# Patient Record
Sex: Female | Born: 1970 | Race: White | Hispanic: No | Marital: Married | Smoking: Former smoker
Health system: Southern US, Community
[De-identification: ages and names within clinical notes are randomized; demographics above are authoritative.]

## PROBLEM LIST (undated history)

## (undated) DIAGNOSIS — F329 Major depressive disorder, single episode, unspecified: Secondary | ICD-10-CM

## (undated) DIAGNOSIS — M069 Rheumatoid arthritis, unspecified: Secondary | ICD-10-CM

## (undated) DIAGNOSIS — F32A Depression, unspecified: Secondary | ICD-10-CM

## (undated) DIAGNOSIS — E079 Disorder of thyroid, unspecified: Secondary | ICD-10-CM

## (undated) HISTORY — PX: MANDIBLE FRACTURE SURGERY: SHX706

## (undated) HISTORY — PX: TONSILLECTOMY: SUR1361

---

## 1999-10-23 ENCOUNTER — Other Ambulatory Visit: Admission: RE | Admit: 1999-10-23 | Discharge: 1999-10-23 | Payer: Self-pay | Admitting: Obstetrics and Gynecology

## 2001-03-17 ENCOUNTER — Other Ambulatory Visit: Admission: RE | Admit: 2001-03-17 | Discharge: 2001-03-17 | Payer: Self-pay | Admitting: *Deleted

## 2002-03-10 ENCOUNTER — Other Ambulatory Visit: Admission: RE | Admit: 2002-03-10 | Discharge: 2002-03-10 | Payer: Self-pay | Admitting: Obstetrics and Gynecology

## 2003-09-14 ENCOUNTER — Other Ambulatory Visit: Admission: RE | Admit: 2003-09-14 | Discharge: 2003-09-14 | Payer: Self-pay | Admitting: Obstetrics and Gynecology

## 2005-01-17 ENCOUNTER — Other Ambulatory Visit: Admission: RE | Admit: 2005-01-17 | Discharge: 2005-01-17 | Payer: Self-pay | Admitting: Obstetrics and Gynecology

## 2007-05-08 ENCOUNTER — Other Ambulatory Visit: Admission: RE | Admit: 2007-05-08 | Discharge: 2007-05-08 | Payer: Self-pay | Admitting: Obstetrics and Gynecology

## 2007-09-29 ENCOUNTER — Encounter: Admission: RE | Admit: 2007-09-29 | Discharge: 2007-09-29 | Payer: Self-pay

## 2015-02-05 ENCOUNTER — Encounter (HOSPITAL_COMMUNITY): Payer: Self-pay | Admitting: Emergency Medicine

## 2015-02-05 ENCOUNTER — Emergency Department (HOSPITAL_COMMUNITY)
Admission: EM | Admit: 2015-02-05 | Discharge: 2015-02-05 | Disposition: A | Discharge status: Home / Self Care | Payer: 59 | Attending: Emergency Medicine | Admitting: Emergency Medicine

## 2015-02-05 ENCOUNTER — Emergency Department (HOSPITAL_COMMUNITY): Payer: 59

## 2015-02-05 DIAGNOSIS — Z87891 Personal history of nicotine dependence: Secondary | ICD-10-CM | POA: Diagnosis not present

## 2015-02-05 DIAGNOSIS — Z79899 Other long term (current) drug therapy: Secondary | ICD-10-CM | POA: Diagnosis not present

## 2015-02-05 DIAGNOSIS — E079 Disorder of thyroid, unspecified: Secondary | ICD-10-CM | POA: Diagnosis not present

## 2015-02-05 DIAGNOSIS — M069 Rheumatoid arthritis, unspecified: Secondary | ICD-10-CM | POA: Diagnosis not present

## 2015-02-05 DIAGNOSIS — S52121A Displaced fracture of head of right radius, initial encounter for closed fracture: Secondary | ICD-10-CM

## 2015-02-05 DIAGNOSIS — F329 Major depressive disorder, single episode, unspecified: Secondary | ICD-10-CM | POA: Insufficient documentation

## 2015-02-05 DIAGNOSIS — S52611A Displaced fracture of right ulna styloid process, initial encounter for closed fracture: Secondary | ICD-10-CM | POA: Insufficient documentation

## 2015-02-05 DIAGNOSIS — Y92830 Public park as the place of occurrence of the external cause: Secondary | ICD-10-CM | POA: Insufficient documentation

## 2015-02-05 DIAGNOSIS — W1839XA Other fall on same level, initial encounter: Secondary | ICD-10-CM | POA: Insufficient documentation

## 2015-02-05 DIAGNOSIS — W19XXXA Unspecified fall, initial encounter: Secondary | ICD-10-CM

## 2015-02-05 DIAGNOSIS — Y9389 Activity, other specified: Secondary | ICD-10-CM | POA: Diagnosis not present

## 2015-02-05 DIAGNOSIS — Y998 Other external cause status: Secondary | ICD-10-CM | POA: Diagnosis not present

## 2015-02-05 DIAGNOSIS — S52201A Unspecified fracture of shaft of right ulna, initial encounter for closed fracture: Secondary | ICD-10-CM

## 2015-02-05 DIAGNOSIS — S6991XA Unspecified injury of right wrist, hand and finger(s), initial encounter: Secondary | ICD-10-CM | POA: Diagnosis present

## 2015-02-05 DIAGNOSIS — S52501A Unspecified fracture of the lower end of right radius, initial encounter for closed fracture: Secondary | ICD-10-CM | POA: Insufficient documentation

## 2015-02-05 HISTORY — DX: Disorder of thyroid, unspecified: E07.9

## 2015-02-05 HISTORY — DX: Major depressive disorder, single episode, unspecified: F32.9

## 2015-02-05 HISTORY — DX: Depression, unspecified: F32.A

## 2015-02-05 HISTORY — DX: Rheumatoid arthritis, unspecified: M06.9

## 2015-02-05 MED ORDER — HYDROMORPHONE HCL 1 MG/ML IJ SOLN
1.0000 mg | Freq: Once | INTRAMUSCULAR | Status: AC
  Administered 2015-02-05: 1 mg via INTRAMUSCULAR
  Filled 2015-02-05: qty 1

## 2015-02-05 MED ORDER — ONDANSETRON 4 MG PO TBDP
4.0000 mg | ORAL_TABLET | Freq: Once | ORAL | Status: AC
  Administered 2015-02-05: 4 mg via ORAL
  Filled 2015-02-05: qty 1

## 2015-02-05 MED ORDER — OXYCODONE-ACETAMINOPHEN 5-325 MG PO TABS: 1.0000 | ORAL_TABLET | Freq: Once | ORAL | Status: DC

## 2015-02-05 MED ORDER — MORPHINE SULFATE (PF) 4 MG/ML IV SOLN
4.0000 mg | Freq: Once | INTRAVENOUS | Status: AC
  Administered 2015-02-05: 4 mg via INTRAMUSCULAR
  Filled 2015-02-05: qty 1

## 2015-02-05 MED ORDER — OXYCODONE-ACETAMINOPHEN 5-325 MG PO TABS: 1.0000 | ORAL_TABLET | ORAL | Status: AC | PRN

## 2015-02-05 NOTE — ED Notes (Signed)
Pt reported rt wrist pain s/p to bracing self while falling.  Noted obvious deformity/swelling. (+)PMS, CRT brisk, LROM, no bruising noted.

## 2015-02-05 NOTE — ED Provider Notes (Signed)
Patient seen by Harolyn Rutherford, PA-C  No results found for this or any previous visit. Dg Forearm Right  02/05/2015  CLINICAL DATA:  Status post fall on outstretched hand while jumping on a trampoline today. Initial encounter. EXAM: RIGHT FOREARM - 2 VIEW COMPARISON:  Plain films of the right wrist this same day. FINDINGS: Distal radius and ulnar fractures are identified as seen on the comparison plain films. No other acute bony or joint abnormality is seen. IMPRESSION: Acute distal right radius and ulnar fractures. No other abnormality. Electronically Signed   By: Drusilla Kanner M.D.   On: 02/05/2015 16:58   Dg Wrist Complete Right  02/05/2015  CLINICAL DATA:  Fall on outstretched hand today. Right wrist pain and deformity EXAM: RIGHT WRIST - COMPLETE 3+ VIEW COMPARISON:  None. FINDINGS: There is a transverse fracture across the distal radial metaphysis. Fracture is displaced in a radial direction by 1 cm. Fracture is also mildly impacted with dorsal angulation of the articular surface. Dorsal angulation measures approximately 34 degrees. There is a small comminuted fracture component that projects over the dorsal margin of the distal radius on the lateral view. There is also an associated ulnar styloid fracture which is displaced in a radial direction, also by approximately 1 cm. The carpus has displaced with the distal radial fracture component. There is no dislocation. No other fractures. There is diffuse surrounding soft tissue swelling. IMPRESSION: Displaced, angulated fracture of the distal radial metaphysis as described with an associated displaced ulnar styloid fracture. Electronically Signed   By: Amie Portland M.D.   On: 02/05/2015 16:58     Teresa Singh  has sustained a significant CLOSED radial ulnar fracture which is surgical. Shawn, PA-C spoke with Dr. Mina Marble, he reviewed the xrays and he requests sugartong splint and she will have surgery in the AM tomorrow.  She has been reminded not  to eat anything after midnight.  RICE and elevate as much as possible. Ring on injured hand removed while in the ED.  Patient has strong radial pulses, intact sensations to all 5 fingers and  Less than 2 second CR in all five digits.    Marlon Pel, PA-C 02/05/15 1751  Marlon Pel, PA-C 02/05/15 1816  Lorre Nick, MD 02/05/15 [phone]

## 2015-02-05 NOTE — ED Provider Notes (Signed)
CSN: [id number]     Arrival date & time 02/05/15  1601 History   First MD Initiated Contact with Patient 02/05/15 1616     Chief Complaint  Patient presents with  . Wrist Injury     (Consider location/radiation/quality/duration/timing/severity/associated sxs/prior Treatment) HPI  Teresa Singh is a 44 y.o. female, pt with no significant previous history, presents with an isolated right wrist injury with obvious deformity after falling on an outstretched arm while playing with her child at a "trampoline park."  Pt denies hitting her head, LOC, neck/back pain, N/V, dizziness, or any other pain or injuries. Pt rates pain 10/10, sharp/throbbing, with radiation up the forearm.   Past Medical History  Diagnosis Date  . Rheumatoid arthritis (HCC)   . Thyroid disease   . Depression    Past Surgical History  Procedure Laterality Date  . Mandible fracture surgery    . Tonsillectomy     History reviewed. No pertinent family history. Social History  Substance Use Topics  . Smoking status: Former Smoker    Types: Cigarettes  . Smokeless tobacco: None  . Alcohol Use: Yes     Comment: occ   OB History    No data available     Review of Systems  Constitutional: Negative for diaphoresis.  Respiratory: Negative for shortness of breath.   Cardiovascular: Negative for chest pain.  Gastrointestinal: Negative for abdominal pain.  Musculoskeletal: Positive for arthralgias. Negative for back pain and neck pain.       Wrist pain  Neurological: Negative for dizziness, syncope, weakness, light-headedness, numbness and headaches.      Allergies  Review of patient's allergies indicates not on file.  Home Medications   Prior to Admission medications   Medication Sig Start Date End Date Taking? Authorizing Provider  ENBREL SURECLICK 50 MG/ML injection Inject 50 mg into the skin once a week. Thursday 11/03/14  Yes Historical Provider, MD  FIBER SELECT GUMMIES PO Take 2 tablets by mouth daily.    Yes Historical Provider, MD  hydroxychloroquine (PLAQUENIL) 200 MG tablet Take 400 mg by mouth daily. 01/25/15  Yes Historical Provider, MD  levothyroxine (SYNTHROID, LEVOTHROID) 100 MCG tablet Take 100 mcg by mouth daily. 02/03/15  Yes Historical Provider, MD  predniSONE (DELTASONE) 1 MG tablet For RA flare-up 01/25/15  Yes Historical Provider, MD  sertraline (ZOLOFT) 50 MG tablet Take 50 mg by mouth daily. 01/07/15  Yes Historical Provider, MD  triamcinolone (KENALOG) 0.025 % cream Apply 1 application topically 3 (three) times daily as needed (break outs).  01/09/15  Yes Historical Provider, MD  oxyCODONE-acetaminophen (PERCOCET/ROXICET) 5-325 MG tablet Take 1-2 tablets by mouth every 4 (four) hours as needed for severe pain. 02/05/15   Shawn C Joy, PA-C   BP 108/63 mmHg  Pulse 59  Temp(Src) 98.2 F (36.8 C) (Oral)  Resp 16  SpO2 98% Physical Exam  Constitutional: She appears well-developed and well-nourished. No distress.  HENT:  Head: Normocephalic and atraumatic.  Eyes: Conjunctivae are normal. Pupils are equal, round, and reactive to light.  Neck: Normal range of motion.  Cardiovascular: Normal rate and regular rhythm.   Capillary refill <2 sec distal to injury.  Pulmonary/Chest: Effort normal. No respiratory distress.  Abdominal: Soft. Bowel sounds are normal.  Musculoskeletal: She exhibits tenderness.  Full ROM in fingers distal to injury and right elbow. Full ROM of all other extremities. No head, c-spine, t-spine, or l-spine pain or tenderness. Motor intact.  Neurological: She is alert.  No numbness, tingling, weakness, or  other neurologic deficits.  Skin: Skin is warm and dry. She is not diaphoretic.  Nursing note and vitals reviewed.   ED Course  Procedures (including critical care time) Labs Review Labs Reviewed - No data to display  Imaging Review Dg Forearm Right  02/05/2015  CLINICAL DATA:  Status post fall on outstretched hand while jumping on a trampoline today.  Initial encounter. EXAM: RIGHT FOREARM - 2 VIEW COMPARISON:  Plain films of the right wrist this same day. FINDINGS: Distal radius and ulnar fractures are identified as seen on the comparison plain films. No other acute bony or joint abnormality is seen. IMPRESSION: Acute distal right radius and ulnar fractures. No other abnormality. Electronically Signed   By: Drusilla Kanner M.D.   On: 02/05/2015 16:58   Dg Wrist Complete Right  02/05/2015  CLINICAL DATA:  Fall on outstretched hand today. Right wrist pain and deformity EXAM: RIGHT WRIST - COMPLETE 3+ VIEW COMPARISON:  None. FINDINGS: There is a transverse fracture across the distal radial metaphysis. Fracture is displaced in a radial direction by 1 cm. Fracture is also mildly impacted with dorsal angulation of the articular surface. Dorsal angulation measures approximately 34 degrees. There is a small comminuted fracture component that projects over the dorsal margin of the distal radius on the lateral view. There is also an associated ulnar styloid fracture which is displaced in a radial direction, also by approximately 1 cm. The carpus has displaced with the distal radial fracture component. There is no dislocation. No other fractures. There is diffuse surrounding soft tissue swelling. IMPRESSION: Displaced, angulated fracture of the distal radial metaphysis as described with an associated displaced ulnar styloid fracture. Electronically Signed   By: Amie Portland M.D.   On: 02/05/2015 16:58   I have personally reviewed and evaluated these images and lab results as part of my medical decision-making.   EKG Interpretation None      MDM   Final diagnoses:  Radial head fracture, closed, right, initial encounter  Ulnar fracture, right, closed, initial encounter  Fall, initial encounter    Teresa Singh presents with obvious deformity to right wrist following a fall onto outstretched arm.   Injury suspicious for wrist fracture. Wrist and forearm  xrays and pain management ordered.  Xray shows displaced radial and ulnar fractures. Consult to hand surgery placed.  Pt updated with xray findings and possible avenues of care. Pt is open to anything that needs to be done. Spoke with Hydrographic surveyor, Dr. Mina Marble for consult. Dr. Mina Marble has xrays in front of him x2. Advised that this case is surgical and he can do it tomorrow or Wednesday, late morning.  Advised to place pt in a sugar tong splint, as is with no manipulation. Asked to pass the following instructions on to patient: call the office tomorrow at 8:30 to receive further instructions. NPO after midnight. This will be an outpatient jprocedure to go home the same day. Requests call back to confirm pt will be there tomorrow. Pt updated with details and proposed surgery date and plan of care. Pt and pt husband agree to plan of care and confirm the above instructions. Populated inbox message sent to Dr. Mina Marble, as requested with pt record attached.   Anselm Pancoast, PA-C 02/05/15 1821  Lorre Nick, MD 02/05/15 [phone]

## 2015-02-05 NOTE — Discharge Instructions (Signed)
You have been seen in the ED this evening for a wrist fracture. Dr. Mina Marble, a hand surgeon, was consulted on this injury and determined it is surgical. You are to call Dr. Ronie Spies office tomorrow morning at 8:30 to receive further instructions for your surgery. Your surgery will be an outpatient procedure, which means you will go home the same day. Nothing by mouth after midnight tonight. Your wrist will be splinted and you will be sent home with pain medications, but be sure to take nothing after midnight, unless the surgery office specifically advises you otherwise.    Cast or Splint Care Casts and splints support injured limbs and keep bones from moving while they heal. It is important to care for your cast or splint at home.  HOME CARE INSTRUCTIONS  Keep the cast or splint uncovered during the drying period. It can take 24 to 48 hours to dry if it is made of plaster. A fiberglass cast will dry in less than 1 hour.  Do not rest the cast on anything harder than a pillow for the first 24 hours.  Do not put weight on your injured limb or apply pressure to the cast until your health care provider gives you permission.  Keep the cast or splint dry. Wet casts or splints can lose their shape and may not support the limb as well. A wet cast that has lost its shape can also create harmful pressure on your skin when it dries. Also, wet skin can become infected.  Cover the cast or splint with a plastic bag when bathing or when out in the rain or snow. If the cast is on the trunk of the body, take sponge baths until the cast is removed.  If your cast does become wet, dry it with a towel or a blow dryer on the cool setting only.  Keep your cast or splint clean. Soiled casts may be wiped with a moistened cloth.  Do not place any hard or soft foreign objects under your cast or splint, such as cotton, toilet paper, lotion, or powder.  Do not try to scratch the skin under the cast with any object. The  object could get stuck inside the cast. Also, scratching could lead to an infection. If itching is a problem, use a blow dryer on a cool setting to relieve discomfort.  Do not trim or cut your cast or remove padding from inside of it.  Exercise all joints next to the injury that are not immobilized by the cast or splint. For example, if you have a long leg cast, exercise the hip joint and toes. If you have an arm cast or splint, exercise the shoulder, elbow, thumb, and fingers.  Elevate your injured arm or leg on 1 or 2 pillows for the first 1 to 3 days to decrease swelling and pain.It is best if you can comfortably elevate your cast so it is higher than your heart. SEEK MEDICAL CARE IF:   Your cast or splint cracks.  Your cast or splint is too tight or too loose.  You have unbearable itching inside the cast.  Your cast becomes wet or develops a soft spot or area.  You have a bad smell coming from inside your cast.  You get an object stuck under your cast.  Your skin around the cast becomes red or raw.  You have new pain or worsening pain after the cast has been applied. SEEK IMMEDIATE MEDICAL CARE IF:   You have  fluid leaking through the cast.  You are unable to move your fingers or toes.  You have discolored (blue or white), cool, painful, or very swollen fingers or toes beyond the cast.  You have tingling or numbness around the injured area.  You have severe pain or pressure under the cast.  You have any difficulty with your breathing or have shortness of breath.  You have chest pain.   This information is not intended to replace advice given to you by your health care provider. Make sure you discuss any questions you have with your health care provider.   Document Released: 04/05/2000 Document Revised: 01/27/2013 Document Reviewed: 10/15/2012 Elsevier Interactive Patient Education [employer]

## 2016-07-12 DIAGNOSIS — R5381 Other malaise: Secondary | ICD-10-CM | POA: Diagnosis not present

## 2016-07-12 DIAGNOSIS — R197 Diarrhea, unspecified: Secondary | ICD-10-CM | POA: Diagnosis not present

## 2016-07-18 DIAGNOSIS — R197 Diarrhea, unspecified: Secondary | ICD-10-CM | POA: Diagnosis not present

## 2016-07-18 DIAGNOSIS — R5381 Other malaise: Secondary | ICD-10-CM | POA: Diagnosis not present

## 2016-07-26 DIAGNOSIS — M0579 Rheumatoid arthritis with rheumatoid factor of multiple sites without organ or systems involvement: Secondary | ICD-10-CM | POA: Diagnosis not present

## 2016-07-26 DIAGNOSIS — G4700 Insomnia, unspecified: Secondary | ICD-10-CM | POA: Diagnosis not present

## 2016-07-26 DIAGNOSIS — Z79899 Other long term (current) drug therapy: Secondary | ICD-10-CM | POA: Diagnosis not present

## 2016-10-21 DIAGNOSIS — E039 Hypothyroidism, unspecified: Secondary | ICD-10-CM | POA: Diagnosis not present

## 2016-10-25 DIAGNOSIS — G4700 Insomnia, unspecified: Secondary | ICD-10-CM | POA: Diagnosis not present

## 2017-01-28 DIAGNOSIS — E785 Hyperlipidemia, unspecified: Secondary | ICD-10-CM | POA: Diagnosis not present

## 2017-01-28 DIAGNOSIS — E039 Hypothyroidism, unspecified: Secondary | ICD-10-CM | POA: Diagnosis not present

## 2017-02-25 DIAGNOSIS — Z23 Encounter for immunization: Secondary | ICD-10-CM | POA: Diagnosis not present

## 2017-02-25 DIAGNOSIS — Z79899 Other long term (current) drug therapy: Secondary | ICD-10-CM | POA: Diagnosis not present

## 2017-02-25 DIAGNOSIS — M0579 Rheumatoid arthritis with rheumatoid factor of multiple sites without organ or systems involvement: Secondary | ICD-10-CM | POA: Diagnosis not present

## 2017-04-28 DIAGNOSIS — E785 Hyperlipidemia, unspecified: Secondary | ICD-10-CM | POA: Diagnosis not present

## 2017-04-28 DIAGNOSIS — E039 Hypothyroidism, unspecified: Secondary | ICD-10-CM | POA: Diagnosis not present

## 2017-04-28 DIAGNOSIS — Z79899 Other long term (current) drug therapy: Secondary | ICD-10-CM | POA: Diagnosis not present

## 2017-05-01 DIAGNOSIS — M25562 Pain in left knee: Secondary | ICD-10-CM | POA: Diagnosis not present

## 2017-05-01 DIAGNOSIS — E039 Hypothyroidism, unspecified: Secondary | ICD-10-CM | POA: Diagnosis not present

## 2017-05-01 DIAGNOSIS — E785 Hyperlipidemia, unspecified: Secondary | ICD-10-CM | POA: Diagnosis not present

## 2017-07-13 DIAGNOSIS — M791 Myalgia, unspecified site: Secondary | ICD-10-CM | POA: Diagnosis not present

## 2017-07-30 DIAGNOSIS — G4700 Insomnia, unspecified: Secondary | ICD-10-CM | POA: Diagnosis not present

## 2017-08-29 DIAGNOSIS — Z79899 Other long term (current) drug therapy: Secondary | ICD-10-CM | POA: Diagnosis not present

## 2017-08-29 DIAGNOSIS — M0579 Rheumatoid arthritis with rheumatoid factor of multiple sites without organ or systems involvement: Secondary | ICD-10-CM | POA: Diagnosis not present

## 2017-10-04 DIAGNOSIS — M25531 Pain in right wrist: Secondary | ICD-10-CM | POA: Diagnosis not present

## 2017-10-07 DIAGNOSIS — M25531 Pain in right wrist: Secondary | ICD-10-CM | POA: Diagnosis not present

## 2017-10-27 DIAGNOSIS — E039 Hypothyroidism, unspecified: Secondary | ICD-10-CM | POA: Diagnosis not present

## 2017-10-27 DIAGNOSIS — E785 Hyperlipidemia, unspecified: Secondary | ICD-10-CM | POA: Diagnosis not present

## 2017-10-29 DIAGNOSIS — G4700 Insomnia, unspecified: Secondary | ICD-10-CM | POA: Diagnosis not present

## 2017-10-29 DIAGNOSIS — R635 Abnormal weight gain: Secondary | ICD-10-CM | POA: Diagnosis not present

## 2017-10-29 DIAGNOSIS — Z79899 Other long term (current) drug therapy: Secondary | ICD-10-CM | POA: Diagnosis not present

## 2017-10-29 DIAGNOSIS — Z6830 Body mass index (BMI) 30.0-30.9, adult: Secondary | ICD-10-CM | POA: Diagnosis not present

## 2017-10-29 DIAGNOSIS — M0579 Rheumatoid arthritis with rheumatoid factor of multiple sites without organ or systems involvement: Secondary | ICD-10-CM | POA: Diagnosis not present

## 2018-01-16 DIAGNOSIS — H6691 Otitis media, unspecified, right ear: Secondary | ICD-10-CM | POA: Diagnosis not present

## 2018-01-16 DIAGNOSIS — Z6830 Body mass index (BMI) 30.0-30.9, adult: Secondary | ICD-10-CM | POA: Diagnosis not present

## 2018-01-29 DIAGNOSIS — Z6830 Body mass index (BMI) 30.0-30.9, adult: Secondary | ICD-10-CM | POA: Diagnosis not present

## 2018-01-29 DIAGNOSIS — G4700 Insomnia, unspecified: Secondary | ICD-10-CM | POA: Diagnosis not present

## 2018-01-29 DIAGNOSIS — R635 Abnormal weight gain: Secondary | ICD-10-CM | POA: Diagnosis not present

## 2018-03-06 DIAGNOSIS — M25531 Pain in right wrist: Secondary | ICD-10-CM | POA: Diagnosis not present

## 2018-03-06 DIAGNOSIS — Z79899 Other long term (current) drug therapy: Secondary | ICD-10-CM | POA: Diagnosis not present

## 2018-03-06 DIAGNOSIS — M0579 Rheumatoid arthritis with rheumatoid factor of multiple sites without organ or systems involvement: Secondary | ICD-10-CM | POA: Diagnosis not present

## 2018-03-06 DIAGNOSIS — Z23 Encounter for immunization: Secondary | ICD-10-CM | POA: Diagnosis not present

## 2019-08-24 ENCOUNTER — Other Ambulatory Visit: Payer: Self-pay

## 2019-08-24 ENCOUNTER — Emergency Department (HOSPITAL_BASED_OUTPATIENT_CLINIC_OR_DEPARTMENT_OTHER): Payer: 59

## 2019-08-24 ENCOUNTER — Encounter (HOSPITAL_BASED_OUTPATIENT_CLINIC_OR_DEPARTMENT_OTHER): Payer: Self-pay | Admitting: Emergency Medicine

## 2019-08-24 ENCOUNTER — Emergency Department (HOSPITAL_BASED_OUTPATIENT_CLINIC_OR_DEPARTMENT_OTHER)
Admission: EM | Admit: 2019-08-24 | Discharge: 2019-08-24 | Disposition: A | Discharge status: Home / Self Care | Payer: 59 | Attending: Emergency Medicine | Admitting: Emergency Medicine

## 2019-08-24 DIAGNOSIS — Z87891 Personal history of nicotine dependence: Secondary | ICD-10-CM | POA: Insufficient documentation

## 2019-08-24 DIAGNOSIS — Z91040 Latex allergy status: Secondary | ICD-10-CM | POA: Diagnosis not present

## 2019-08-24 DIAGNOSIS — L03213 Periorbital cellulitis: Secondary | ICD-10-CM | POA: Diagnosis not present

## 2019-08-24 DIAGNOSIS — Z79899 Other long term (current) drug therapy: Secondary | ICD-10-CM | POA: Insufficient documentation

## 2019-08-24 DIAGNOSIS — H02843 Edema of right eye, unspecified eyelid: Secondary | ICD-10-CM | POA: Diagnosis present

## 2019-08-24 LAB — CBC WITH DIFFERENTIAL/PLATELET
Abs Immature Granulocytes: 0.01 10*3/uL (ref 0.00–0.07)
Basophils Absolute: 0 10*3/uL (ref 0.0–0.1)
Basophils Relative: 0 %
Eosinophils Absolute: 0 10*3/uL (ref 0.0–0.5)
Eosinophils Relative: 1 %
HCT: 35.2 % — ABNORMAL LOW (ref 36.0–46.0)
Hemoglobin: 11.9 g/dL — ABNORMAL LOW (ref 12.0–15.0)
Immature Granulocytes: 0 %
Lymphocytes Relative: 22 %
Lymphs Abs: 1 10*3/uL (ref 0.7–4.0)
MCH: 30.9 pg (ref 26.0–34.0)
MCHC: 33.8 g/dL (ref 30.0–36.0)
MCV: 91.4 fL (ref 80.0–100.0)
Monocytes Absolute: 0.3 10*3/uL (ref 0.1–1.0)
Monocytes Relative: 7 %
Neutro Abs: 3.3 10*3/uL (ref 1.7–7.7)
Neutrophils Relative %: 70 %
Platelets: 251 10*3/uL (ref 150–400)
RBC: 3.85 MIL/uL — ABNORMAL LOW (ref 3.87–5.11)
RDW: 12.2 % (ref 11.5–15.5)
WBC: 4.6 10*3/uL (ref 4.0–10.5)
nRBC: 0 % (ref 0.0–0.2)

## 2019-08-24 LAB — BASIC METABOLIC PANEL
Anion gap: 8 (ref 5–15)
BUN: 17 mg/dL (ref 6–20)
CO2: 26 mmol/L (ref 22–32)
Calcium: 8.3 mg/dL — ABNORMAL LOW (ref 8.9–10.3)
Chloride: 104 mmol/L (ref 98–111)
Creatinine, Ser: 0.81 mg/dL (ref 0.44–1.00)
GFR calc Af Amer: >60 mL/min (ref >60)
GFR calc non Af Amer: >60 mL/min (ref >60)
Glucose, Bld: 93 mg/dL (ref 70–99)
Potassium: 3.8 mmol/L (ref 3.5–5.1)
Sodium: 138 mmol/L (ref 135–145)

## 2019-08-24 LAB — URINALYSIS, ROUTINE W REFLEX MICROSCOPIC
Bilirubin Urine: NEGATIVE
Glucose, UA: NEGATIVE mg/dL
Hgb urine dipstick: NEGATIVE
Ketones, ur: NEGATIVE mg/dL
Leukocytes,Ua: NEGATIVE
Nitrite: NEGATIVE
Protein, ur: NEGATIVE mg/dL
Specific Gravity, Urine: <1.005 — ABNORMAL LOW (ref 1.005–1.030)
pH: 6 (ref 5.0–8.0)

## 2019-08-24 LAB — PREGNANCY, URINE: Preg Test, Ur: NEGATIVE

## 2019-08-24 MED ORDER — AMOXICILLIN-POT CLAVULANATE 875-125 MG PO TABS: 1.0000 | ORAL_TABLET | Freq: Two times a day (BID) | ORAL | 0 refills | Status: AC

## 2019-08-24 MED ORDER — IOHEXOL 300 MG/ML  SOLN
100.0000 mL | Freq: Once | INTRAMUSCULAR | Status: AC | PRN
  Administered 2019-08-24: 07:00:00 100 mL via INTRAVENOUS

## 2019-08-24 MED FILL — AMOX-CLAV 875-125 MG TABLET: 875-125 | 7 days supply | Qty: 14 | Fill #0

## 2019-08-24 NOTE — ED Provider Notes (Signed)
[facility] EMERGENCY DEPARTMENT Provider Note   CSN: [id number] Arrival date & time: 08/24/19  0631     History Chief Complaint  Patient presents with  . Facial Swelling    Teresa Singh is a 49 y.o. female.  She has a history of rheumatoid arthritis.  She is complaining of swelling around her right eye that started yesterday morning.  She received her second Covid vaccine after that and then went to the dentist and found out that she had a possible tooth infection also right upper.  The dentist put her on penicillin.  She woke up this morning and her eye was even more swollen.  She says it is itchy.  There is no real eye pain and no difficulty with vision or moving her eye.  She does not recall any new contact to the skin but also has a new patch of rash on her left neck.  She said she was in the yard doing yard work this weekend.  No fevers chills nausea vomiting trouble swallowing or breathing.  The history is provided by the patient.  Eye Problem Location:  Right eye Quality: itching. Severity:  Moderate Onset quality:  Gradual Duration:  2 days Timing:  Constant Progression:  Worsening Chronicity:  New Relieved by:  Nothing Worsened by:  Nothing Ineffective treatments: cool compress. Associated symptoms: itching and swelling   Associated symptoms: no blurred vision, no crusting, no discharge, no double vision, no headaches, no nausea, no photophobia, no redness, no scotomas and no vomiting        Past Medical History:  Diagnosis Date  . Depression   . Rheumatoid arthritis (HCC)   . Thyroid disease     There are no problems to display for this patient.   Past Surgical History:  Procedure Laterality Date  . MANDIBLE FRACTURE SURGERY    . TONSILLECTOMY       OB History   No obstetric history on file.     History reviewed. No pertinent family history.  Social History   Tobacco Use  . Smoking status: Former Smoker    Types: Cigarettes  .  Smokeless tobacco: Never Used  Substance Use Topics  . Alcohol use: Yes    Comment: occ  . Drug use: No    Home Medications Prior to Admission medications   Medication Sig Start Date End Date Taking? Authorizing Provider  penicillin v potassium (VEETID) 500 MG tablet Take 500 mg by mouth 4 (four) times daily.   Yes [provider]  ENBREL SURECLICK 50 MG/ML injection Inject 50 mg into the skin once a week. Thursday 11/03/14   [provider]  FIBER SELECT GUMMIES PO Take 2 tablets by mouth daily.    [provider]  hydroxychloroquine (PLAQUENIL) 200 MG tablet Take 400 mg by mouth daily. 01/25/15   [provider]  levothyroxine (SYNTHROID, LEVOTHROID) 100 MCG tablet Take 100 mcg by mouth daily. 02/03/15   [provider]  oxyCODONE-acetaminophen (PERCOCET/ROXICET) 5-325 MG tablet Take 1-2 tablets by mouth every 4 (four) hours as needed for severe pain. 02/05/15   Joy, Shawn C, PA-C  predniSONE (DELTASONE) 1 MG tablet For RA flare-up 01/25/15   [provider]  sertraline (ZOLOFT) 50 MG tablet Take 50 mg by mouth daily. 01/07/15   [provider]  triamcinolone (KENALOG) 0.025 % cream Apply 1 application topically 3 (three) times daily as needed (break outs).  01/09/15   [provider]    Allergies  Latex and Sulfamethoxazole  Review of Systems   Review of Systems  Constitutional: Negative for fever.  HENT: Positive for facial swelling. Negative for sore throat.   Eyes: Positive for itching. Negative for blurred vision, double vision, photophobia, discharge, redness and visual disturbance.  Respiratory: Negative for shortness of breath.   Cardiovascular: Negative for chest pain.  Gastrointestinal: Negative for abdominal pain, nausea and vomiting.  Genitourinary: Negative for dysuria.  Musculoskeletal: Negative for neck pain.  Skin: Positive for rash (2x2cm area sl red raised patch on neck).  Neurological:  Negative for headaches.    Physical Exam Updated Vital Signs BP 125/70 (BP Location: Right Arm)   Pulse 62   Temp 98.2 F (36.8 C) (Oral)   Resp 18   Ht 5\' 11"  (1.803 m)   Wt 104.3 kg   SpO2 97%   BMI 32.08 kg/m   Physical Exam Vitals and nursing note reviewed.  Constitutional:      General: She is not in acute distress.    Appearance: She is well-developed.  HENT:     Head: Normocephalic and atraumatic.  Eyes:     Conjunctiva/sclera: Conjunctivae normal.     Comments: She has some puffiness to the lid of her right eye upper and lower.  Eye itself clear anterior chamber pupil equal round reactive extraocular movements intact.  No findings on left eye.  Neck:     Comments: She is approximately 2 cm x 2 cm linear patch left lower neck raised slightly reddened without any weeping Cardiovascular:     Rate and Rhythm: Normal rate and regular rhythm.     Heart sounds: No murmur.  Pulmonary:     Effort: Pulmonary effort is normal. No respiratory distress.     Breath sounds: Normal breath sounds.  Abdominal:     Palpations: Abdomen is soft.     Tenderness: There is no abdominal tenderness.  Musculoskeletal:        General: No deformity or signs of injury.     Cervical back: Neck supple.  Skin:    General: Skin is warm and dry.     Capillary Refill: Capillary refill takes less than 2 seconds.  Neurological:     General: No focal deficit present.     Mental Status: She is alert.     ED Results / Procedures / Treatments   Labs (all labs ordered are listed, but only abnormal results are displayed) Labs Reviewed  BASIC METABOLIC PANEL - Abnormal; Notable for the following components:      Result Value   Calcium 8.3 (*)    All other components within normal limits  CBC WITH DIFFERENTIAL/PLATELET - Abnormal; Notable for the following components:   RBC 3.85 (*)    Hemoglobin 11.9 (*)    HCT 35.2 (*)    All other components within normal limits  URINALYSIS, ROUTINE W  REFLEX MICROSCOPIC - Abnormal; Notable for the following components:   Specific Gravity, Urine <1.005 (*)    All other components within normal limits  PREGNANCY, URINE    EKG None  Radiology CT Orbits W Contrast  Result Date: 08/24/2019 CLINICAL DATA:  Mass, lump or swelling, max face. Additional history provided: Patient presents with complaints of right eye swelling onset yesterday, worse this morning, received second COVID vaccine yesterday, went to dentist and was treated with penicillin for tooth infection. EXAM: CT ORBITS WITH CONTRAST TECHNIQUE: Multidetector CT images was performed according to the standard protocol following intravenous contrast administration. CONTRAST:  125mL OMNIPAQUE IOHEXOL 300 MG/ML  SOLN COMPARISON:  No pertinent prior studies available for comparison. FINDINGS: Orbits: There is right periorbital stranding and skin thickening consistent with periorbital cellulitis. No evidence of postseptal extension of infection at this time. No formed abscess is demonstrated. The globes are normal in size and contour. The extraocular muscles are symmetric and unremarkable. Visualized sinuses: Small mucous retention cysts within an anterior left ethmoid air cell and within the inferior maxillary sinuses bilaterally. Soft tissues: Right periorbital stranding and skin thickening suggestive periorbital cellulitis. Otherwise unremarkable. Limited intracranial: No abnormality identified. IMPRESSION: Right periorbital soft tissue stranding and skin thickening consistent with periorbital cellulitis. No evidence of postseptal extension of infection at this time. No formed abscess is demonstrated. Small mucous retention cysts within an anterior left ethmoid air cell and within the bilateral maxillary sinuses. The paranasal sinuses are otherwise well aerated. Electronically Signed   By: Kellie Simmering DO   On: 08/24/2019 07:51    Procedures Procedures (including critical care  time)  Medications Ordered in ED Medications - No data to display  ED Course  I have reviewed the triage vital signs and the nursing notes.  Pertinent labs & imaging results that were available during my care of the patient were reviewed by me and considered in my medical decision making (see chart for details).    MDM Rules/Calculators/A&P                     This patient complains of swelling around her right eye; this involves an extensive number of treatment Options and is a complaint that carries with it a high risk of complications and Morbidity. The differential includes allergic reaction, contact dermatitis, early preseptal cellulitis, orbital cellulitis, retrobulbar mass, nephrotic syndrome  I ordered, reviewed and interpreted labs, which included normal white counts stable hemoglobin normal chemistries negative urine including protein, pregnancy test negative  I ordered imaging studies which included CT orbits and I independently    visualized and interpreted imaging which showed no retrobulbar hemorrhage or edema.  Does have some superficial premature changes.  After the interventions stated above, I reevaluated the patient and found the patient to be stable for discharge.  Reviewed the findings on the CAT scan and your lab work.  We will have her stop her penicillin and start on Augmentin for broader coverage for preseptal cellulitis.  Likely there is an allergic component to and recommended she continue the cool compresses and consideration of steroids of not improving.   Final Clinical Impression(s) / ED Diagnoses Final diagnoses:  Preseptal cellulitis of right eye    Rx / DC Orders ED Discharge Orders         Ordered    amoxicillin-clavulanate (AUGMENTIN) 875-125 MG tablet  Every 12 hours     08/24/19 0926           Hayden Rasmussen, MD 08/24/19 1754

## 2019-08-24 NOTE — ED Notes (Signed)
Pt unable to provide a urine sample at this time.

## 2019-08-24 NOTE — Discharge Instructions (Signed)
You are seen in the emergency department for evaluation of swelling around your right eye.  You had blood work that was unremarkable.  Your CAT scan showed some early signs of infection in the lids around the eye.  You can stop the penicillin and please start Augmentin twice a day for a week.  This should also help with any kind of dental infection.  Continue to use a cool compress.  Avoid rubbing your eye.  If your symptoms do not improve please return to the emergency department.

## 2019-08-24 NOTE — ED Triage Notes (Signed)
Patient presents with complaints of right eye swelling onset yesterday; states worse this am. States yesterday she received the 2nd series of the covid vaccine; then went to the dentist and we treated with pcn for possible tooth infection; states eye swelling worsening this am. Denies itching, redness, drainage.

## 2019-08-25 ENCOUNTER — Ambulatory Visit (HOSPITAL_COMMUNITY)
Admission: EM | Admit: 2019-08-25 | Discharge: 2019-08-25 | Disposition: A | Discharge status: Home / Self Care | Payer: 59 | Source: Home / Self Care | Attending: Family Medicine | Admitting: Family Medicine

## 2019-08-25 ENCOUNTER — Telehealth: Payer: 59

## 2019-08-25 ENCOUNTER — Encounter (HOSPITAL_COMMUNITY): Payer: Self-pay

## 2019-08-25 ENCOUNTER — Other Ambulatory Visit: Payer: Self-pay

## 2019-08-25 ENCOUNTER — Emergency Department (HOSPITAL_COMMUNITY)
Admission: EM | Admit: 2019-08-25 | Discharge: 2019-08-26 | Disposition: A | Discharge status: Home / Self Care | Payer: 59 | Attending: Emergency Medicine | Admitting: Emergency Medicine

## 2019-08-25 DIAGNOSIS — R220 Localized swelling, mass and lump, head: Secondary | ICD-10-CM | POA: Diagnosis not present

## 2019-08-25 DIAGNOSIS — L03213 Periorbital cellulitis: Secondary | ICD-10-CM | POA: Diagnosis not present

## 2019-08-25 DIAGNOSIS — Z87891 Personal history of nicotine dependence: Secondary | ICD-10-CM | POA: Diagnosis not present

## 2019-08-25 DIAGNOSIS — M069 Rheumatoid arthritis, unspecified: Secondary | ICD-10-CM | POA: Diagnosis not present

## 2019-08-25 DIAGNOSIS — Z79899 Other long term (current) drug therapy: Secondary | ICD-10-CM | POA: Insufficient documentation

## 2019-08-25 MED ORDER — SULFAMETHOXAZOLE-TRIMETHOPRIM 800-160 MG PO TABS: 1.0000 | ORAL_TABLET | Freq: Two times a day (BID) | ORAL | 0 refills | Status: AC

## 2019-08-25 MED ORDER — SULFAMETHOXAZOLE-TRIMETHOPRIM 800-160 MG PO TABS: 1.0000 | ORAL_TABLET | Freq: Two times a day (BID) | ORAL | 0 refills | Status: DC

## 2019-08-25 NOTE — Discharge Instructions (Signed)
Please try the bactrim. If you are not better after two doses then you will need to be seen in the emergency department for the consideration of IV antibiotics.

## 2019-08-25 NOTE — ED Triage Notes (Signed)
Pt seen in ER last night for facial swelling, started on Amoxicillin but states swelling is worse today

## 2019-08-25 NOTE — ED Triage Notes (Signed)
Right eye swelling since sunday. Been seen for same several times. Rx Augmentin

## 2019-08-25 NOTE — ED Provider Notes (Signed)
[facility]    CSN: [id number] Arrival date & time: 08/25/19  3151      History   Chief Complaint Chief Complaint  Patient presents with  . Facial Swelling    HPI Teresa Singh is a 49 y.o. female.   She is presenting with worsening right eye pain and swelling.  She was seen in the emergency department last night and diagnosed with preseptal cellulitis.  She was started on Augmentin.  Her symptoms have gotten worse over the past day.  She denies any changes in vision or blurry vision.  She is having some headaches and tooth pain.  She denies any fevers.  HPI  Past Medical History:  Diagnosis Date  . Depression   . Rheumatoid arthritis (HCC)   . Thyroid disease     There are no problems to display for this patient.   Past Surgical History:  Procedure Laterality Date  . MANDIBLE FRACTURE SURGERY    . TONSILLECTOMY      OB History   No obstetric history on file.      Home Medications    Prior to Admission medications   Medication Sig Start Date End Date Taking? Authorizing Provider  amoxicillin-clavulanate (AUGMENTIN) 875-125 MG tablet Take 1 tablet by mouth every 12 (twelve) hours. 08/24/19   Terrilee Files, MD  ENBREL SURECLICK 50 MG/ML injection Inject 50 mg into the skin once a week. Thursday 11/03/14   [provider]  FIBER SELECT GUMMIES PO Take 2 tablets by mouth daily.    [provider]  hydroxychloroquine (PLAQUENIL) 200 MG tablet Take 400 mg by mouth daily. 01/25/15   [provider]  levothyroxine (SYNTHROID, LEVOTHROID) 100 MCG tablet Take 100 mcg by mouth daily. 02/03/15   [provider]  oxyCODONE-acetaminophen (PERCOCET/ROXICET) 5-325 MG tablet Take 1-2 tablets by mouth every 4 (four) hours as needed for severe pain. 02/05/15   Joy, Shawn C, PA-C  penicillin v potassium (VEETID) 500 MG tablet Take 500 mg by mouth 4 (four) times daily.    [provider]  predniSONE (DELTASONE) 1 MG tablet For RA  flare-up 01/25/15   [provider]  sertraline (ZOLOFT) 50 MG tablet Take 50 mg by mouth daily. 01/07/15   [provider]  sulfamethoxazole-trimethoprim (BACTRIM DS) 800-160 MG tablet Take 1 tablet by mouth 2 (two) times daily. 08/25/19   Myra Rude, MD  triamcinolone (KENALOG) 0.025 % cream Apply 1 application topically 3 (three) times daily as needed (break outs).  01/09/15   [provider]    Family History No family history on file.  Social History Social History   Tobacco Use  . Smoking status: Former Smoker    Types: Cigarettes  . Smokeless tobacco: Never Used  Substance Use Topics  . Alcohol use: Yes    Comment: occ  . Drug use: No     Allergies   Latex and Sulfamethoxazole   Review of Systems Review of Systems  See HPI  Physical Exam Triage Vital Signs ED Triage Vitals  Enc Vitals Group     BP 08/25/19 1840 108/76     Pulse Rate 08/25/19 1840 66     Resp 08/25/19 1840 16     Temp 08/25/19 1840 98.3 F (36.8 C)     Temp src --      SpO2 08/25/19 1840 99 %     Weight --      Height --      Head Circumference --  Peak Flow --      Pain Score 08/25/19 1841 5     Pain Loc --      Pain Edu? --      Excl. in [place]? --    No data found.  Updated Vital Signs BP 108/76   Pulse 66   Temp 98.3 F (36.8 C)   Resp 16   SpO2 99%   Visual Acuity Right Eye Distance:   Left Eye Distance:   Bilateral Distance:    Right Eye Near:   Left Eye Near:    Bilateral Near:     Physical Exam Gen: NAD, alert, cooperative with exam, well-appearing ENT: normal lips, normal nasal mucosa,  Eye: normal EOM, normal conjunctiva, redness and swelling occurring over the periorbital region on the right, pupils are equal and reactive, tenderness palpation over this area.     UC Treatments / Results  Labs (all labs ordered are listed, but only abnormal results are displayed) Labs Reviewed - No data to display  EKG   Radiology CT  Orbits W Contrast  Result Date: 08/24/2019 CLINICAL DATA:  Mass, lump or swelling, max face. Additional history provided: Patient presents with complaints of right eye swelling onset yesterday, worse this morning, received second COVID vaccine yesterday, went to dentist and was treated with penicillin for tooth infection. EXAM: CT ORBITS WITH CONTRAST TECHNIQUE: Multidetector CT images was performed according to the standard protocol following intravenous contrast administration. CONTRAST:  177mL OMNIPAQUE IOHEXOL 300 MG/ML  SOLN COMPARISON:  No pertinent prior studies available for comparison. FINDINGS: Orbits: There is right periorbital stranding and skin thickening consistent with periorbital cellulitis. No evidence of postseptal extension of infection at this time. No formed abscess is demonstrated. The globes are normal in size and contour. The extraocular muscles are symmetric and unremarkable. Visualized sinuses: Small mucous retention cysts within an anterior left ethmoid air cell and within the inferior maxillary sinuses bilaterally. Soft tissues: Right periorbital stranding and skin thickening suggestive periorbital cellulitis. Otherwise unremarkable. Limited intracranial: No abnormality identified. IMPRESSION: Right periorbital soft tissue stranding and skin thickening consistent with periorbital cellulitis. No evidence of postseptal extension of infection at this time. No formed abscess is demonstrated. Small mucous retention cysts within an anterior left ethmoid air cell and within the bilateral maxillary sinuses. The paranasal sinuses are otherwise well aerated. Electronically Signed   By: Kellie Simmering DO   On: 08/24/2019 07:51    Procedures Procedures (including critical care time)  Medications Ordered in UC Medications - No data to display  Initial Impression / Assessment and Plan / UC Course  I have reviewed the triage vital signs and the nursing notes.  Pertinent labs & imaging results  that were available during my care of the patient were reviewed by me and considered in my medical decision making (see chart for details).     Teresa Singh is a 49 year old female is presenting with preseptal cellulitis.  She was started on Augmentin last night.  Her symptoms have gotten worse today.  We had a long conversation about seeking further care in the emergency department but she would like to try outpatient with the addition of Bactrim.  Advised that if she takes a dose tonight and tomorrow and no improvement, then she will need to be seen in the emergency department for the consideration of IV antibiotics and hospitalization.    Final Clinical Impressions(s) / UC Diagnoses   Final diagnoses:  Periorbital cellulitis of right eye  Discharge Instructions     Please try the bactrim. If you are not better after two doses then you will need to be seen in the emergency department for the consideration of IV antibiotics.      ED Prescriptions    Medication Sig Dispense Auth. Provider   sulfamethoxazole-trimethoprim (BACTRIM DS) 800-160 MG tablet  (Status: Discontinued) Take 1 tablet by mouth 2 (two) times daily. 14 tablet Myra Rude, MD   sulfamethoxazole-trimethoprim (BACTRIM DS) 800-160 MG tablet Take 1 tablet by mouth 2 (two) times daily. 14 tablet Myra Rude, MD     PDMP not reviewed this encounter.   Myra Rude, MD 08/25/19 1944

## 2019-08-26 MED ORDER — DOXYCYCLINE HYCLATE 100 MG PO TABS
100.0000 mg | ORAL_TABLET | Freq: Once | ORAL | Status: AC
  Administered 2019-08-26: 100 mg via ORAL
  Filled 2019-08-26: qty 1

## 2019-08-26 MED ORDER — DOXYCYCLINE HYCLATE 100 MG PO CAPS: 100.0000 mg | ORAL_CAPSULE | Freq: Two times a day (BID) | ORAL | 0 refills | Status: AC

## 2019-08-26 NOTE — ED Provider Notes (Signed)
[facility] DEPT Provider Note   CSN: [id number] Arrival date & time: 08/25/19  2109     History Chief Complaint  Patient presents with  . Facial Swelling    Teresa Singh is a 49 y.o. female with past medical history of rheumatoid arthritis, has not had her Enbrel in 6 weeks so that she could get Covid vaccines, who presents today for evaluation of swelling on the right side of the face.  She reports that 3 days ago she was given penicillin by her dentist for a tooth infection.  She reports that since then she has had swelling around the right eye.  She was seen in the emergency room yesterday and started on Augmentin.  She has had 2 doses of Augmentin.  She states that today she went to urgent care as it had become more swollen and they directed her to the emergency room.  She denies any fevers.  No vision changes.  She states that she has been taking Benadryl in addition without relief of her symptoms.  She does not have a history of similar.  She denies any shortness of breath, or cough.  No intraoral swelling or change in phonation.  HPI     Past Medical History:  Diagnosis Date  . Depression   . Rheumatoid arthritis ([place])   . Thyroid disease     There are no problems to display for this patient.   Past Surgical History:  Procedure Laterality Date  . MANDIBLE FRACTURE SURGERY    . TONSILLECTOMY       OB History   No obstetric history on file.     No family history on file.  Social History   Tobacco Use  . Smoking status: Former Smoker    Types: Cigarettes  . Smokeless tobacco: Never Used  Substance Use Topics  . Alcohol use: Yes    Comment: occ  . Drug use: No    Home Medications Prior to Admission medications   Medication Sig Start Date End Date Taking? Authorizing Provider  amoxicillin-clavulanate (AUGMENTIN) 875-125 MG tablet Take 1 tablet by mouth every 12 (twelve) hours. 08/24/19  Yes Hayden Rasmussen, MD  diphenhydrAMINE  (BENADRYL) 25 MG tablet Take 25 mg by mouth every 6 (six) hours as needed for itching or allergies.   Yes [provider]  predniSONE (DELTASONE) 1 MG tablet For RA flare-up 01/25/15  Yes [provider]  triamcinolone (KENALOG) 0.025 % cream Apply 1 application topically 3 (three) times daily as needed (break outs).  01/09/15  Yes [provider]  doxycycline (VIBRAMYCIN) 100 MG capsule Take 1 capsule (100 mg total) by mouth 2 (two) times daily. 08/26/19   Lorin Glass, PA-C  ENBREL SURECLICK 50 MG/ML injection Inject 50 mg into the skin once a week. Thursday 11/03/14   [provider]  FIBER SELECT GUMMIES PO Take 2 tablets by mouth daily.    [provider]  hydroxychloroquine (PLAQUENIL) 200 MG tablet Take 400 mg by mouth daily. 01/25/15   [provider]  levothyroxine (SYNTHROID, LEVOTHROID) 100 MCG tablet Take 100 mcg by mouth daily. 02/03/15   [provider]  oxyCODONE-acetaminophen (PERCOCET/ROXICET) 5-325 MG tablet Take 1-2 tablets by mouth every 4 (four) hours as needed for severe pain. 02/05/15   Joy, Shawn C, PA-C  sertraline (ZOLOFT) 50 MG tablet Take 50 mg by mouth daily. 01/07/15   [provider]  sulfamethoxazole-trimethoprim (BACTRIM DS) 800-160 MG tablet Take 1 tablet by mouth  2 (two) times daily. 08/25/19   Myra Rude, MD    Allergies    Latex and Sulfamethoxazole  Review of Systems   Review of Systems  Constitutional: Negative for chills and fever.  HENT: Positive for facial swelling. Negative for dental problem, ear pain, sinus pressure, sinus pain, trouble swallowing and voice change.   Eyes: Negative for pain, redness and visual disturbance.  Respiratory: Negative for chest tightness and shortness of breath.   Cardiovascular: Negative for chest pain.  Gastrointestinal: Negative for abdominal pain.  Endocrine: Negative for polyuria.  Genitourinary: Negative for dysuria and urgency.    Musculoskeletal: Negative for back pain and neck pain.  Skin: Negative for color change.  Neurological: Negative for speech difficulty, weakness and headaches.  Psychiatric/Behavioral: Negative for confusion.  All other systems reviewed and are negative.   Physical Exam Updated Vital Signs BP (!) 147/80 (BP Location: Right Arm)   Pulse 78   Temp 98.4 F (36.9 C) (Oral)   Resp 16   Ht 5\' 11"  (1.803 m)   Wt 104.3 kg   SpO2 99%   BMI 32.08 kg/m   Physical Exam Vitals and nursing note reviewed.  Constitutional:      General: She is not in acute distress.    Appearance: She is well-developed. She is not diaphoretic.  HENT:     Head: Normocephalic and atraumatic.     Comments: Please see clinical images.  There is edema around the right eye, primarily inferior however also mild edema on the upper lid.  The area is not significantly indurated.  There are no visible lacerations or wounds.    Right Ear: Tympanic membrane normal.     Left Ear: Tympanic membrane normal.     Nose: Nose normal.     Mouth/Throat:     Mouth: Mucous membranes are moist.  Eyes:     General: No scleral icterus.       Right eye: No discharge.        Left eye: No discharge.     Extraocular Movements: Extraocular movements intact.     Conjunctiva/sclera: Conjunctivae normal.     Pupils: Pupils are equal, round, and reactive to light.     Comments: No pain with EOMs bilaterally.  Cardiovascular:     Rate and Rhythm: Normal rate and regular rhythm.  Pulmonary:     Effort: Pulmonary effort is normal. No respiratory distress.     Breath sounds: No stridor.  Abdominal:     General: There is no distension.     Tenderness: There is no abdominal tenderness.  Musculoskeletal:        General: No deformity.     Cervical back: Normal range of motion.  Skin:    General: Skin is warm and dry.  Neurological:     Mental Status: She is alert.     Motor: No abnormal muscle tone.  Psychiatric:        Behavior:  Behavior normal.           ED Results / Procedures / Treatments   Labs (all labs ordered are listed, but only abnormal results are displayed) Labs Reviewed - No data to display  EKG None  Radiology- obtained at prior visit.  CT Orbits W Contrast  Result Date: 08/24/2019 CLINICAL DATA:  Mass, lump or swelling, max face. Additional history provided: Patient presents with complaints of right eye swelling onset yesterday, worse this morning, received second COVID vaccine yesterday, went to dentist and was  treated with penicillin for tooth infection. EXAM: CT ORBITS WITH CONTRAST TECHNIQUE: Multidetector CT images was performed according to the standard protocol following intravenous contrast administration. CONTRAST:  OMNIPAQUE IOHEXOL 300 MG/ML  SOLN COMPARISON:  No pertinent prior studies available for comparison. FINDINGS: Orbits: There is right periorbital stranding and skin thickening consistent with periorbital cellulitis. No evidence of postseptal extension of infection at this time. No formed abscess is demonstrated. The globes are normal in size and contour. The extraocular muscles are symmetric and unremarkable. Visualized sinuses: Small mucous retention cysts within an anterior left ethmoid air cell and within the inferior maxillary sinuses bilaterally. Soft tissues: Right periorbital stranding and skin thickening suggestive periorbital cellulitis. Otherwise unremarkable. Limited intracranial: No abnormality identified. IMPRESSION: Right periorbital soft tissue stranding and skin thickening consistent with periorbital cellulitis. No evidence of postseptal extension of infection at this time. No formed abscess is demonstrated. Small mucous retention cysts within an anterior left ethmoid air cell and within the bilateral maxillary sinuses. The paranasal sinuses are otherwise well aerated. Electronically Signed   By: Jackey Loge DO   On: 08/24/2019 07:51     Procedures Procedures  (including critical care time)  Medications Ordered in ED Medications  doxycycline (VIBRA-TABS) tablet 100 mg (100 mg Oral Given 08/26/19 0022)    ED Course  I have reviewed the triage vital signs and the nursing notes.  Pertinent labs & imaging results that were available during my care of the patient were reviewed by me and considered in my medical decision making (see chart for details).    MDM Rules/Calculators/A&P                     Patient is a 48 year old woman who presents today for evaluation of facial swelling.  She was seen yesterday in the emergency room and had a CT scan, was started on Augmentin.  She has had 24 hours of Augmentin.  Before that she had 24 hours of penicillin.  She is here today as she has had increasing swelling throughout the day, she went to an urgent care and was told she needed to come to the emergency room.  On exam she does have edema primarily on the lower eyelid.  Here she is afebrile, not tachycardic or tachypneic.  She does not have any pain with extraocular range of motion.  No evidence of orbital cellulitis.  Both my self and Dr. Rush Landmark had conversation with patient about treatment plan.  At this point she has only had 24 hours of appropriate oral antibiotics (she was on PCN for only 24 hours).  She had been put on Bactrim by urgent care, however has had documented reactions to sulfamethoxazole, so will change to doxycycline.    We did offer labs, IV antibiotics, however we discussed that, while she may worsen, she may have improvement as it can take 48 hours to see results from antibiotics.    After discussion of the risks and benefits she elected for close outpatient follow up.  She is aware that she may get worse, however wishes to avoid a possibly unneeded hospitalization at this time.  Return precautions were discussed with patient who states their understanding.  At the time of discharge patient denied any unaddressed complaints or concerns.   Patient is agreeable for discharge home.  Note: Portions of this report may have been transcribed using voice recognition software. Every effort was made to ensure accuracy; however, inadvertent computerized transcription errors may be present  Final Clinical Impression(s) / ED Diagnoses Final diagnoses:  Facial swelling    Rx / DC Orders ED Discharge Orders         Ordered    doxycycline (VIBRAMYCIN) 100 MG capsule  2 times daily     08/26/19 0036           Cristina Gong, PA-C 08/26/19 0710    Tegeler, Canary Brim, MD 08/30/19 2351

## 2019-08-26 NOTE — Discharge Instructions (Addendum)
Please continue to take pictures of your face every 6-12 hours.   If you develop fevers, any vision changes or worsening symptoms please seek additional medical care and evaluation.  If you do not start seeing improvement in 24 hours please return to the emergency room.  May always return sooner if you have worsening condition or concerns.  You may have diarrhea from the antibiotics.  It is very important that you continue to take the antibiotics even if you get diarrhea unless a medical professional tells you that you may stop taking them.  If you stop too early the bacteria you are being treated for will become stronger and you may need different, more powerful antibiotics that have more side effects and worsening diarrhea.  Please stay well hydrated and consider probiotics as they may decrease the severity of your diarrhea.  Please be aware that if you take any hormonal contraception (birth control pills, nexplanon, the ring, etc) that your birth control will not work while you are taking antibiotics and you need to use back up protection as directed on the birth control medication information insert.   Doxycycline may make you more likely to sun burn.

## 2022-03-28 IMAGING — CT CT ORBITS W/ CM
3 series · 14 of 47 positions shown, 16 images · IV contrast (Omnipaque)
Comparison: No pertinent prior studies available for comparison.

CLINICAL DATA: Mass, lump or swelling, max face. Additional history
provided: Patient presents with complaints of right eye swelling
onset yesterday, worse this morning, received second COVID vaccine
yesterday, went to dentist and was treated with penicillin for tooth
infection.

EXAM:
CT ORBITS WITH CONTRAST
TECHNIQUE: Multidetector CT images was performed according to the standard
protocol following intravenous contrast administration.
CONTRAST:  100mL OMNIPAQUE IOHEXOL 300 MG/ML  SOLN

[Series 3: orbits 2.0 h30s st · axial · 0.38mm/px · z∈[-176,-84]mm · 8 of 54 slices shown, 10 images]
[im 4/54  brain]
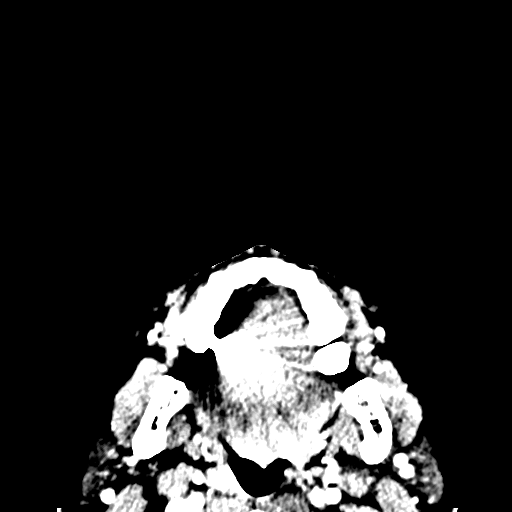
[im 4/54  bone]
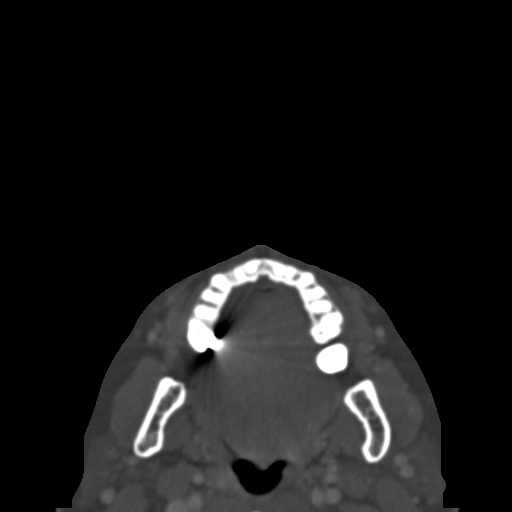
[im 11/54  bone]
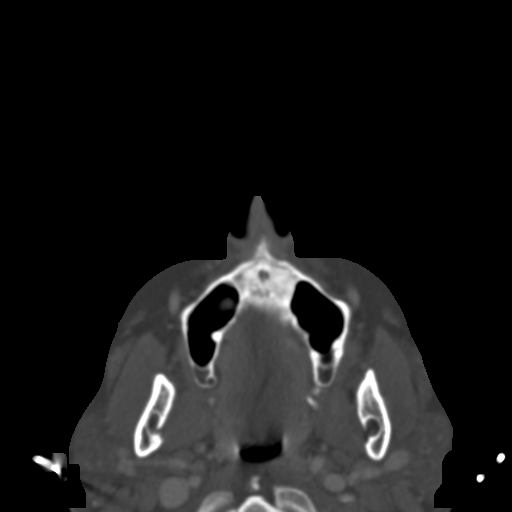
[im 17/54  bone]
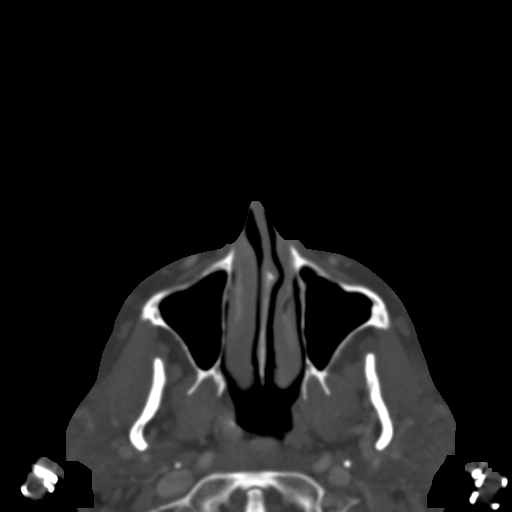
[im 24/54  bone]
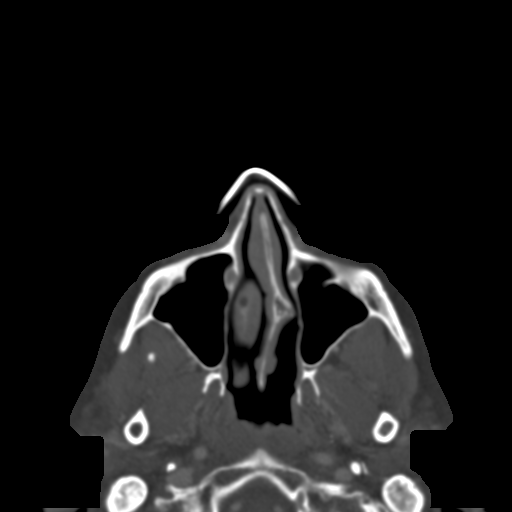
[im 30/54  brain]
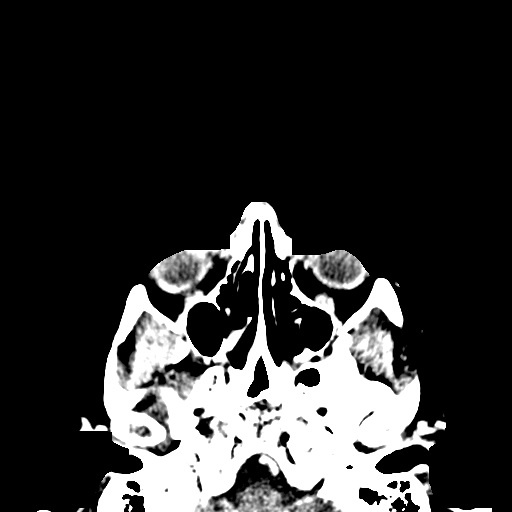
[im 30/54  bone]
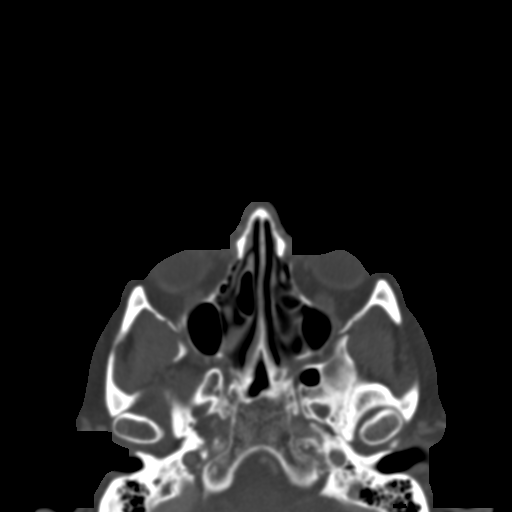
[im 37/54  bone]
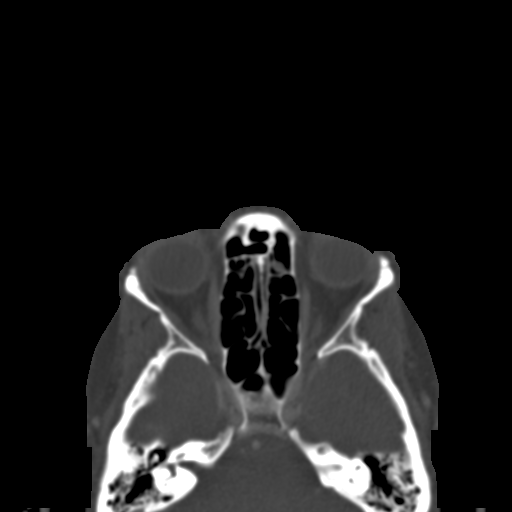
[im 43/54  bone]
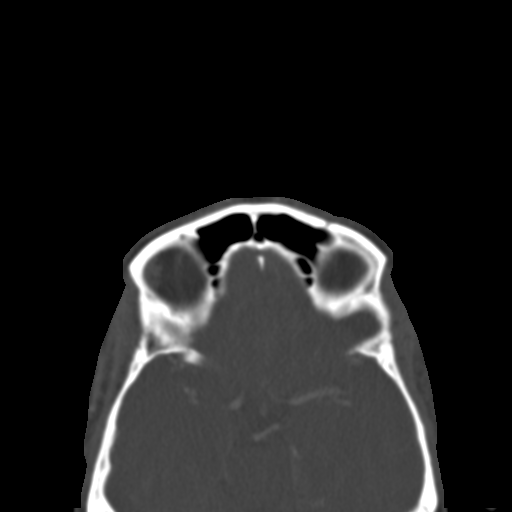
[im 50/54  bone]
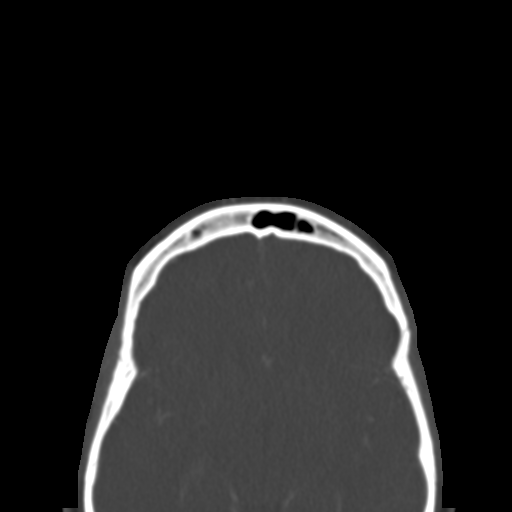

[Series 8: orbits 2.0 coronal · coronal · 0.22mm/px · 3 of 68 slices shown]
[im 23/68  bone]
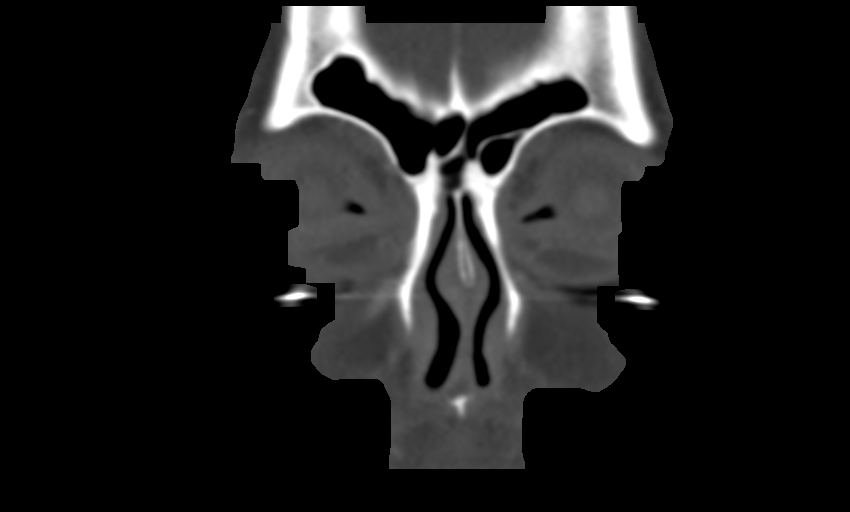
[im 30/68  bone]
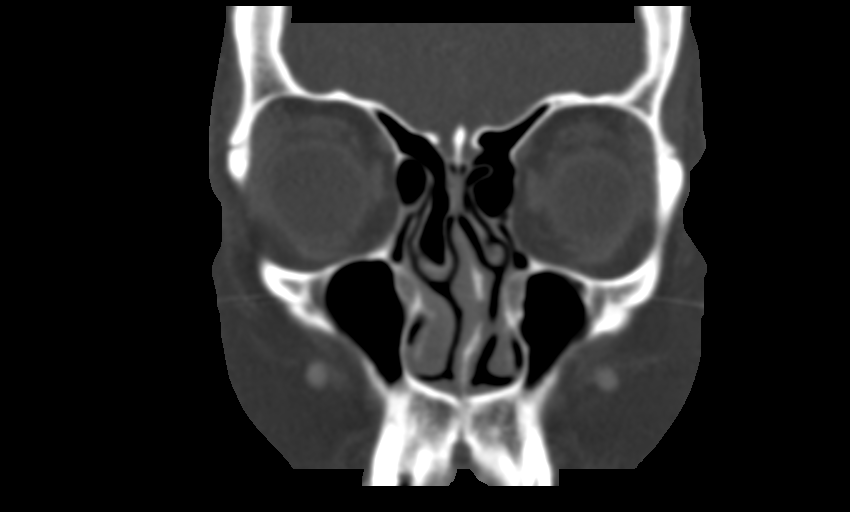
[im 38/68  bone]
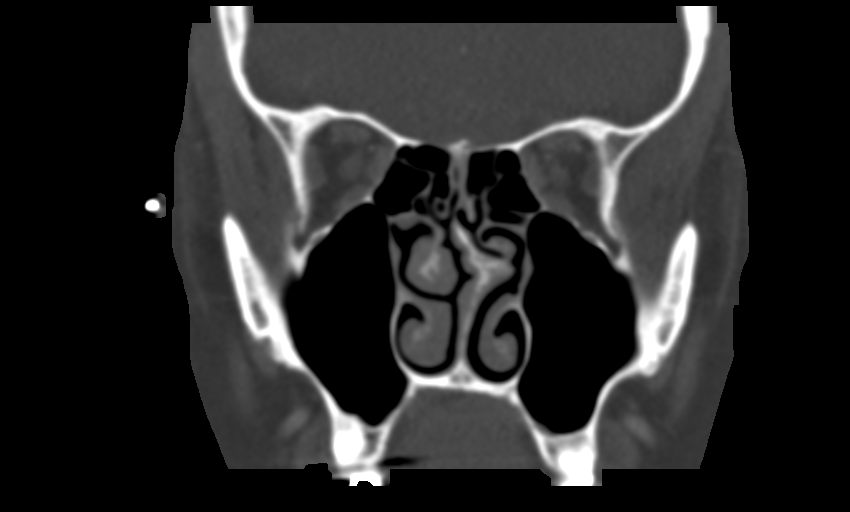

[Series 9: orbits 2.0 sagittal · sagittal · 0.23mm/px · 3 of 86 slices shown]
[im 29/86  bone]
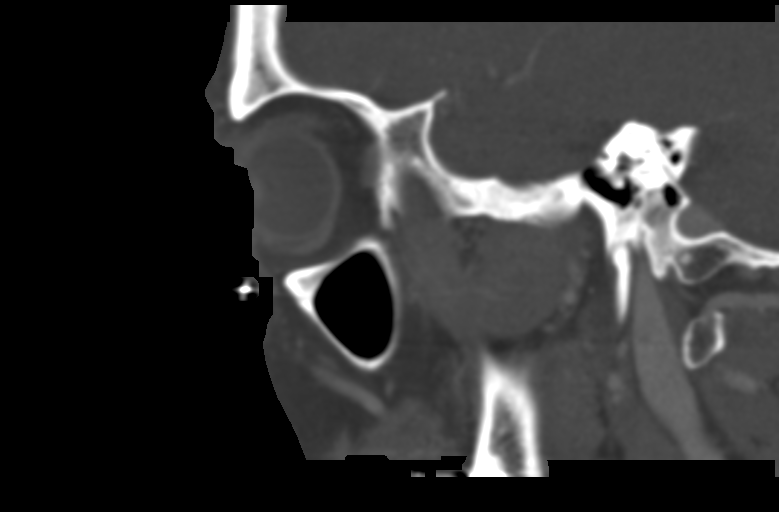
[im 43/86  bone]
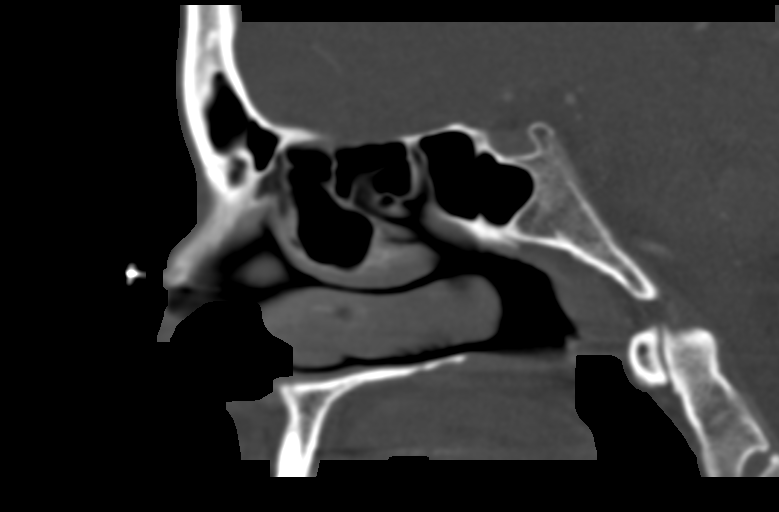
[im 57/86  bone]
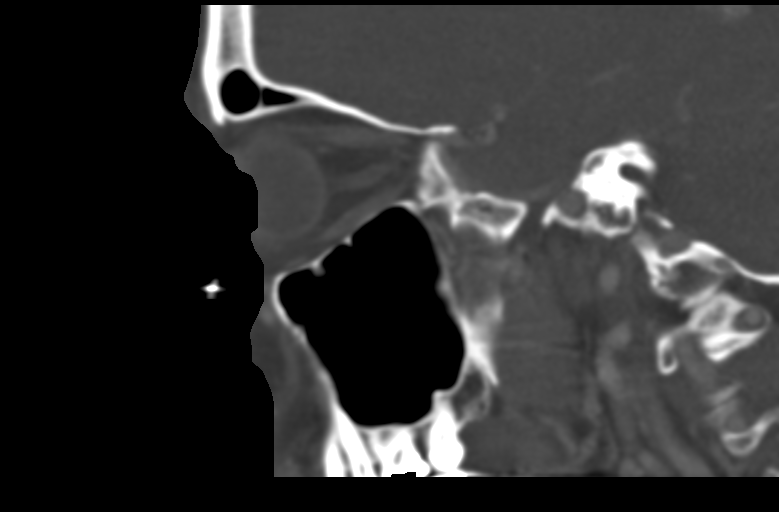

[14 of 47 positions shown; findings below may reference images not displayed]

FINDINGS: Orbits: There is right periorbital stranding and skin thickening
consistent with periorbital cellulitis. No evidence of postseptal
extension of infection at this time. No formed abscess is
demonstrated. The globes are normal in size and contour. The
extraocular muscles are symmetric and unremarkable.

Visualized sinuses: Small mucous retention cysts within an anterior
left ethmoid air cell and within the inferior maxillary sinuses
bilaterally.

Soft tissues: Right periorbital stranding and skin thickening
suggestive periorbital cellulitis. Otherwise unremarkable.

Limited intracranial: No abnormality identified.
IMPRESSION: Right periorbital soft tissue stranding and skin thickening
consistent with periorbital cellulitis. No evidence of postseptal
extension of infection at this time. No formed abscess is
demonstrated.

Small mucous retention cysts within an anterior left ethmoid air
cell and within the bilateral maxillary sinuses. The paranasal
sinuses are otherwise well aerated.

## 2023-06-14 ENCOUNTER — Other Ambulatory Visit: Payer: Self-pay | Admitting: Rheumatology

## 2023-06-14 DIAGNOSIS — M0579 Rheumatoid arthritis with rheumatoid factor of multiple sites without organ or systems involvement: Secondary | ICD-10-CM

## 2023-11-10 ENCOUNTER — Other Ambulatory Visit: Payer: Self-pay | Admitting: Radiology

## 2023-11-26 ENCOUNTER — Ambulatory Visit

## 2023-11-26 DIAGNOSIS — M0579 Rheumatoid arthritis with rheumatoid factor of multiple sites without organ or systems involvement: Secondary | ICD-10-CM | POA: Diagnosis not present

## 2024-02-09 ENCOUNTER — Other Ambulatory Visit: Payer: 59
